# Patient Record
Sex: Male | Born: 2000 | Race: Black or African American | Hispanic: No | Marital: Single
Health system: Southern US, Community
[De-identification: ages and names within clinical notes are randomized; demographics above are authoritative.]

---
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
## 2024-08-01 ENCOUNTER — Ambulatory Visit
Admission: RE | Admit: 2024-08-01 | Discharge: 2024-08-01 | Disposition: A | Source: Ambulatory Visit | Attending: Occupational Medicine | Admitting: Occupational Medicine

## 2024-08-01 ENCOUNTER — Other Ambulatory Visit: Payer: Self-pay | Admitting: Occupational Medicine

## 2024-08-01 DIAGNOSIS — Z021 Encounter for pre-employment examination: Secondary | ICD-10-CM
